# Patient Record
Sex: Female | Born: 1975 | Hispanic: No | Smoking: Current some day smoker
Health system: Southern US, Community
[De-identification: ages and names within clinical notes are randomized; demographics above are authoritative.]

## PROBLEM LIST (undated history)

## (undated) DIAGNOSIS — F419 Anxiety disorder, unspecified: Secondary | ICD-10-CM

## (undated) DIAGNOSIS — F32A Depression, unspecified: Secondary | ICD-10-CM

## (undated) DIAGNOSIS — G4700 Insomnia, unspecified: Secondary | ICD-10-CM

## (undated) DIAGNOSIS — E785 Hyperlipidemia, unspecified: Secondary | ICD-10-CM

## (undated) DIAGNOSIS — F329 Major depressive disorder, single episode, unspecified: Secondary | ICD-10-CM

## (undated) DIAGNOSIS — F3181 Bipolar II disorder: Secondary | ICD-10-CM

## (undated) HISTORY — DX: Bipolar II disorder: F31.81

## (undated) HISTORY — DX: Hyperlipidemia, unspecified: E78.5

## (undated) HISTORY — DX: Major depressive disorder, single episode, unspecified: F32.9

## (undated) HISTORY — PX: TONSILLECTOMY: SUR1361

## (undated) HISTORY — DX: Insomnia, unspecified: G47.00

## (undated) HISTORY — DX: Depression, unspecified: F32.A

## (undated) HISTORY — DX: Anxiety disorder, unspecified: F41.9

---

## 2009-04-28 ENCOUNTER — Ambulatory Visit: Payer: Self-pay | Admitting: Gynecology

## 2009-06-16 ENCOUNTER — Ambulatory Visit: Payer: Self-pay | Admitting: Obstetrics and Gynecology

## 2010-06-25 LAB — CONVERTED CEMR LAB: Pap Smear: NORMAL

## 2010-06-26 ENCOUNTER — Ambulatory Visit: Payer: Self-pay | Admitting: Gynecology

## 2010-06-26 ENCOUNTER — Other Ambulatory Visit: Admission: RE | Admit: 2010-06-26 | Discharge: 2010-06-26 | Payer: Self-pay | Admitting: Gynecology

## 2010-07-07 ENCOUNTER — Encounter: Payer: Self-pay | Admitting: Internal Medicine

## 2010-07-07 ENCOUNTER — Ambulatory Visit: Payer: Self-pay | Admitting: Gynecology

## 2010-07-14 ENCOUNTER — Ambulatory Visit: Payer: Self-pay | Admitting: Internal Medicine

## 2010-07-14 DIAGNOSIS — E785 Hyperlipidemia, unspecified: Secondary | ICD-10-CM

## 2010-07-14 DIAGNOSIS — F17200 Nicotine dependence, unspecified, uncomplicated: Secondary | ICD-10-CM

## 2010-07-15 ENCOUNTER — Encounter: Payer: Self-pay | Admitting: Internal Medicine

## 2010-09-02 ENCOUNTER — Ambulatory Visit
Admission: RE | Admit: 2010-09-02 | Discharge: 2010-09-02 | Payer: Self-pay | Source: Home / Self Care | Attending: Internal Medicine | Admitting: Internal Medicine

## 2010-09-02 DIAGNOSIS — G4700 Insomnia, unspecified: Secondary | ICD-10-CM | POA: Insufficient documentation

## 2010-09-02 DIAGNOSIS — F3181 Bipolar II disorder: Secondary | ICD-10-CM | POA: Insufficient documentation

## 2010-09-08 NOTE — Assessment & Plan Note (Signed)
Summary: NEW TO EST/ CHOLESTROL PROBLEM//PH   Vital Signs:  Patient profile:   35 year old female Height:      66.5 inches Weight:      175.13 pounds BMI:     27.94 Pulse rate:   75 / minute Pulse rhythm:   regular BP sitting:   122 / 80  (left arm) Cuff size:   regular  Vitals Entered By: Army Fossa CMA (July 14, 2010 3:30 PM) CC: New to establish,  Comments Refered by gyn- cholesterol was high.  [facility]    History of Present Illness: New patient Julia Frank to see her gynecologist Dr. Lily Peer, Pap smear was normal, her FLP was checked twice, the last FLP is as follows:  Total cholesterol 237, triglycerides 304, LDL 149. Apparently her B12 was   low.  TSH reportedly normal  Preventive Screening-Counseling & Management  Alcohol-Tobacco     Alcohol type: weekends     Smoking Status: current     Packs/Day: 10 a day  Caffeine-Diet-Exercise     Does Patient Exercise: no      Drug Use:  no.    Current Medications (verified): 1)  Zyrtec Allergy 10 Mg Tabs (Cetirizine Hcl) .[name].. 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   Risk Factors: Exercise: no (07/14/2010)  Risk Factors: Smoking Status: current (07/14/2010) Packs/Day: 10 a day (07/14/2010)  Past Medical History: h/o elevated cholesterol     Past Surgical History: Tonsillectomy  Family History: Diabetes-- GF HTN--  ?M MI-- GF (old age) colon ca--no breast ca--no  Social History: divorced no children original from [place]  Current Smoker-- < 1ppd Alcohol use-yes Drug  use-no 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   Neck:  no thyromegaly Lungs:  normal respiratory effort, no intercostal retractions, no accessory muscle use, and normal breath sounds.   Heart:  normal rate, regular rhythm, no murmur, and no gallop.   Extremities:  no lower extremity edema   Impression & Recommendations:  Problem # 1:  HYPERLIPIDEMIA (ICD-272.4) mild hyperlipidemia based on her last FLP, her triglycerides were 304. Patient reports that even at a younger age when she was well within a healthy   BMI her cholesterol was slightly elevated. She  moved to the [place] in 2009, admits to some increasing weight since then. He had extensive conversation in reference to diet and exercise. plan:  refer her to a nutritionist [facility])  Orders: Nutrition Referral (Nutrition)  Problem # 2:  TOBACCO ABUSE (ICD-305.1)  we had extensive conversation about tobacco abuse Risks and complications discussed She tried Chantix before and could not tolerate it due to depression we discussed the use of a patch Information provided regards a quitting program @ the hospital  Orders: Tobacco use cessation intermediate 3-10 minutes (78469)  Problem # 3:  time spent more  than 20 minutes. CC Dr. Lily Peer  Complete Medication List: 1)  Zyrtec Allergy 10 Mg Tabs (Cetirizine hcl) .[name].. 1 by mouth daily 2)  Nasonex 50 Mcg/act Susp (Mometasone furoate) 3)  B12   Patient Instructions: 1)  came back fasting in 3 months for labs only 2)  FLP---dx hyperlipidemia 3)  Please schedule a follow-up appointment in 6 months .    Orders Added: 1)  New Patient Level II [99202] 2)  Tobacco use cessation intermediate 3-10 minutes [99406] 3)   Nutrition Referral [Nutrition]   Immunization History:  Tetanus/Td Immunization History:    Tetanus/Td:  historical (08/10/2007)   Immunization History:  Tetanus/Td Immunization History:    Tetanus/Td:  Historical (08/10/2007)   Risk Factors:  Tobacco use:  current    Cigarettes:  Yes -- 10 a day pack(s) per day Drug use:  no Alcohol use:  yes    Type:  weekends Exercise:  no  PAP Smear History:     Date of Last PAP Smear:  06/25/2010    Results:  normal-per pt      Preventive Care Screening  Pap Smear:    Date:  06/25/2010    Results:  normal-per pt   Last Tetanus Booster:    Date:  08/10/2007    Results:  Historical

## 2010-09-08 NOTE — Letter (Signed)
Summary: Generic Letter  [facility] [place]  [address] [place], [place] [address]   Phone: [phone]  Fax: [phone]      07/15/2010    REF: Jasmaine Carne [address] [place], [place]  [address]   Dr Reynaldo Minium  Dear [name]:  Thank you for sending Julia Frank to our practice, she has mild to moderate  hypertriglyceridemia; she is also a smoker.  We discussed diet, exercise and tobacco cessation.  Will recheck her in 3 months. I'm  enclosing my office note but if you have any questions please don't hesitate to call me.  Again, thank you very much for send her to our practice.  Sincerely,         Willow Ora MD  Appended Document: Generic Letter faxed to [email or web address].

## 2010-09-10 NOTE — Assessment & Plan Note (Signed)
Summary: discuss sleeping pill/kn   Vital Signs:  Patient profile:   35 year old female Weight:      173 pounds Pulse rate:   86 / minute Pulse rhythm:   regular BP sitting:   120 / 82  (left arm) Cuff size:   regular  Vitals Entered By: Army Fossa CMA (September 02, 2010 4:14 PM) CC: Pt here to discuss sleeping aid and something for depression Comments [facility]    History of Present Illness: long history of insomnia, she used to take clonazepam with good results. Lately she has been taking Unisom which caused her to be drowsy next day.  Also long history of anxiety and depression, in the past she tried Lexapro which helped   but was very expensive.  She also try sertraline before which  worked relatively well. her depression has been on and off for years, she self restart sertraline 50 mg  ~ 3  month ago after she split from her boy friend (they are together again)   review of systems Currently, her depression is relatively okay but  needs  improvement. She also has some anxiety No suicidal ideas  Current Medications (verified): 1)  Zyrtec Allergy 10 Mg Tabs (Cetirizine Hcl) .[name].. 1 By Mouth Daily 2)  Nasonex 50 Mcg/act Susp (Mometasone Furoate) 3)  B12  Allergies (verified): No Known Drug Allergies  Past History:  Past Medical History: h/o elevated cholesterol   insomnia Anxiety  Past Surgical History: Reviewed history from 07/14/2010 and no changes required. Tonsillectomy  Social History: divorced no children original from [place] , moved to [place]  ~ 2009 Current Smoker-- < 1ppd Alcohol use-yes Drug use-no Regular exercise- improved elementary school teacher   Physical Exam  General:  alert and well-developed.   Psych:  Oriented X3, normally interactive, good eye contact, and not depressed appearing.  slightly anxious   Impression & Recommendations:  Problem # 1:  ANXIETY (ICD-300.00) history of anxiety and depression Lexapro  helped very well but it was expensive Currently taking sertraline 50 mg daily and symptoms needs improvment Options include to go back to Lexapro, increase sertraline or switch to another agent. She elected to increase sertraline Will reassess in few  months strongly recommended to see a counselor  Her updated medication list for this problem includes:    Clonazepam 0.25 Mg Tbdp (Clonazepam) .[name]... 1 or 2 by mouth at bedtime as needed insomnia    Sertraline Hcl 50 Mg Tabs (Sertraline hcl) .[name]... 1.5 tabs daily  Problem # 2:  INSOMNIA-SLEEP DISORDER-UNSPEC (ICD-780.52) long history of insomnia, clonazepam worked well for her she is aware of risk of addiction which in her case seems low. Also knows not to take it if pregnant. Prescribed lorazepam to be taken as needed  Problem # 3:  face to face today >  15 minutes, more than 50% of the time counseling  Complete Medication List: 1)  Zyrtec Allergy 10 Mg Tabs (Cetirizine hcl) .[name].. 1 by mouth daily 2)  Nasonex 50 Mcg/act Susp (Mometasone furoate) 3)  B12  4)  Clonazepam 0.25 Mg Tbdp (Clonazepam) .[name].. 1 or 2 by mouth at bedtime as needed insomnia 5)  Sertraline Hcl 50 Mg Tabs (Sertraline hcl) .... 1.5 tabs daily  Patient Instructions: 1)  Please schedule a follow-up appointment in 8 to 10 weeks , fasting Prescriptions: SERTRALINE HCL 50 MG TABS (SERTRALINE HCL) 1.5 tabs daily  #60 x 1   Entered and Authorized by:   Wanda Plump  MD   Signed by:   Nolon Rod Reggie Welge MD on 09/02/2010   Method used:   Print then Give to Patient   RxID:   [id number] CLONAZEPAM 0.25 MG TBDP (CLONAZEPAM) 1 or 2 by mouth at bedtime as needed insomnia  #60 x 1   Entered and Authorized by:   Elita Quick E. Roger Kettles MD   Signed by:   Nolon Rod. Deirdra Heumann MD on 09/02/2010   Method used:   Print then Give to Patient   RxID:   [id number]    Orders Added: 1)  Est. Patient Level III [84696]

## 2010-09-14 ENCOUNTER — Telehealth: Payer: Self-pay | Admitting: Internal Medicine

## 2010-09-24 NOTE — Progress Notes (Signed)
Summary: Clonazepam clarification  Phone Note Refill Request Message from:  Pharmacy on September 14, 2010 11:19 AM  Refills Requested: Medication #1:  CLONAZEPAM 0.25 MG TBDP 1 or 2 by mouth at bedtime as needed insomnia Pharmacy called wondering if patient dosage was decreased on purpose. They state she was previously on 1mg . Please advise.  Initial call taken by: Lucious Groves CMA,  September 14, 2010 11:19 AM  Follow-up for Phone Call        she was taking a higher dose prescribed by somebody else. Please keep her on the  0.25 mg tablet Follow-up by: Nolon Rod. Tashya Alberty MD,  September 14, 2010 4:05 PM  Additional Follow-up for Phone Call Additional follow up Details #1::        Lm at home and work to call back to office. Lucious Groves CMA  September 14, 2010 4:12 PM   No return call, left message on machine at home and work to call back to office. Lucious Groves CMA  September 15, 2010 10:47 AM   Patient notified of the above and she states that she was previously using drops and that 0.25mg  does not exist. She states that she needs 0.5 mg tabs. She uses [facility]. Please advise. Lucious Groves CMA  September 15, 2010 11:03 AM     Additional Follow-up for Phone Call Additional follow up Details #2::    ok to change, same sig. Laurell Coalson E. Lakaya Tolen MD  September 15, 2010 1:08 PM   Additional Follow-up for Phone Call Additional follow up Details #3:: Details for Additional Follow-up Action Taken: Phoned to pharmacy. Left message on voicemail notifying the patient. Additional Follow-up by: Lucious Groves CMA,  September 16, 2010 9:09 AM  New/Updated Medications: CLONAZEPAM 0.5 MG TABS (CLONAZEPAM) 1 or 2 by mouth at bedtime as needed insomnia Prescriptions: CLONAZEPAM 0.5 MG TABS (CLONAZEPAM) 1 or 2 by mouth at bedtime as needed insomnia  #60 x 1   Entered by:   Lucious Groves CMA   Authorized by:   Nolon Rod. Prairie Stenberg MD   Signed by:   Lucious Groves CMA on 09/16/2010   Method used:   Telephoned to ...  [facility]  [address]  [phone]* (retail)       [address]       [place], [place]  [address]       Ph: [phone] or [phone]       Fax: [phone]   RxID:   [id number]

## 2010-10-28 ENCOUNTER — Other Ambulatory Visit: Payer: Self-pay | Admitting: Internal Medicine

## 2010-10-29 MED ORDER — CLONAZEPAM 0.5 MG PO TABS: 0.5000 mg | ORAL_TABLET | Freq: Two times a day (BID) | ORAL | Status: DC

## 2010-10-29 MED ORDER — SERTRALINE HCL 50 MG PO TABS: 50.0000 mg | ORAL_TABLET | Freq: Every day | ORAL | Status: DC

## 2010-10-29 NOTE — Telephone Encounter (Signed)
Please call clonazepam 0.5 mg one or 2 at bedtime as needed for insomnia. #60, one refill.  okay sertraline, #60, one refill Needs a  office visit within 2 months

## 2010-11-07 ENCOUNTER — Encounter: Payer: Self-pay | Admitting: Internal Medicine

## 2010-11-10 ENCOUNTER — Ambulatory Visit (INDEPENDENT_AMBULATORY_CARE_PROVIDER_SITE_OTHER): Payer: BC Managed Care – PPO | Admitting: Internal Medicine

## 2010-11-10 ENCOUNTER — Encounter: Payer: Self-pay | Admitting: Internal Medicine

## 2010-11-10 DIAGNOSIS — R7989 Other specified abnormal findings of blood chemistry: Secondary | ICD-10-CM

## 2010-11-10 DIAGNOSIS — F411 Generalized anxiety disorder: Secondary | ICD-10-CM

## 2010-11-10 DIAGNOSIS — E785 Hyperlipidemia, unspecified: Secondary | ICD-10-CM

## 2010-11-10 DIAGNOSIS — G4700 Insomnia, unspecified: Secondary | ICD-10-CM

## 2010-11-10 NOTE — Assessment & Plan Note (Signed)
Chronic poor control Pt suggested psych referal: will arrange Cont same meds for now

## 2010-11-10 NOTE — Assessment & Plan Note (Signed)
No change 

## 2010-11-10 NOTE — Assessment & Plan Note (Signed)
Chol levated @ gyn 06-2010 recheck

## 2010-11-10 NOTE — Assessment & Plan Note (Signed)
CBC @ gyn 06-2010 showed a elevated MCV Recheck

## 2010-11-10 NOTE — Progress Notes (Signed)
  Subjective:    Patient ID: Julia Frank, female    DOB: 06-17-1976, 35 y.o.   MRN: [id number]  HPI Last office visit was 08/2010, we increase sertraline 50mg  to 1.5 tab a day, good compliance; her sx however have not improved ; gets really anxious on Sundays specifically  Needs cholesterol checked Wonders why she was recommended folic acid in the past, chart reviewed, MCV was slightly elevated at gynecology. That is probably the reason. See assessment and plan   Past Medical History  Diagnosis Date  . Elevated cholesterol   . Insomnia   . Anxiety    Past Surgical History  Procedure Date  . Tonsillectomy    History   Social History  . Marital Status: Divorced    Spouse Name: N/A    Number of Children: 0  . Years of Education: N/A   Occupational History  . Elementary school teacher    Social History Main Topics  . Smoking status: Current Everyday Smoker -- 1.0 packs/day  . Smokeless tobacco: Not on file  . Alcohol Use: Yes     socially   . Drug Use: No  . Sexually Active: Not on file   Other Topics Concern  . Not on file   Social History Narrative   Original from [place], moved to [place].       Review of Systems Has not exercising much lately Diet still ok Sleeps mostly "OK", clonazepam not helping as much as before  No suicidal ideas     Objective:   Physical Exam  Constitutional: She appears well-developed and well-nourished.  Cardiovascular: Normal rate, regular rhythm and normal heart sounds.   Pulmonary/Chest: Effort normal and breath sounds normal. No respiratory distress. She has no wheezes.  Musculoskeletal: She exhibits no edema.  Psychiatric: Her behavior is normal. Judgment and thought content normal.       slt anxious , no depressed          Assessment & Plan:

## 2010-11-11 LAB — LIPID PANEL
Cholesterol: 234 mg/dL — ABNORMAL HIGH (ref 0–200)
VLDL: 69.6 mg/dL — ABNORMAL HIGH (ref 0.0–40.0)

## 2010-11-11 LAB — FOLATE: Folate: 14.2 ng/mL (ref >5.9)

## 2010-11-11 LAB — VITAMIN B12: Vitamin B-12: 281 pg/mL (ref 211–911)

## 2010-12-10 ENCOUNTER — Telehealth: Payer: Self-pay | Admitting: Internal Medicine

## 2010-12-10 NOTE — Telephone Encounter (Signed)
Both were last refilled on 10/28/10-- okay to refill?

## 2010-12-10 NOTE — Telephone Encounter (Signed)
Patient called [place] because she is confused about a message that [facility] [address] gave to her----patient needs refills on her Sertraline and her Clonazepam---if one of those meds  [facility], she needs the other---if neither one has been called in, then she needs both called in

## 2010-12-11 MED ORDER — SERTRALINE HCL 50 MG PO TABS: ORAL_TABLET | ORAL | Status: DC

## 2010-12-11 MED ORDER — CLONAZEPAM 0.5 MG PO TABS: 0.5000 mg | ORAL_TABLET | Freq: Two times a day (BID) | ORAL | Status: DC

## 2010-12-11 NOTE — Telephone Encounter (Signed)
Okay to call clonazepam, 60 tablets no refill. Ok to call sertraline 1 month  and 2 refills

## 2011-01-07 ENCOUNTER — Other Ambulatory Visit: Payer: Self-pay | Admitting: Internal Medicine

## 2011-01-07 NOTE — Telephone Encounter (Signed)
Last refilled 12/11/10 #60, no rfs.

## 2011-01-08 MED ORDER — CLONAZEPAM 0.5 MG PO TABS: 0.5000 mg | ORAL_TABLET | Freq: Two times a day (BID) | ORAL | Status: DC

## 2011-01-08 NOTE — Telephone Encounter (Signed)
60, 1 RF 

## 2011-02-02 ENCOUNTER — Other Ambulatory Visit: Payer: Self-pay | Admitting: Internal Medicine

## 2011-02-02 MED ORDER — SERTRALINE HCL 50 MG PO TABS: ORAL_TABLET | ORAL | Status: DC

## 2011-02-02 NOTE — Telephone Encounter (Signed)
Sent in new script  

## 2011-02-23 ENCOUNTER — Encounter: Payer: Self-pay | Admitting: Internal Medicine

## 2011-02-23 ENCOUNTER — Ambulatory Visit: Payer: BC Managed Care – PPO | Admitting: Family Medicine

## 2011-02-23 ENCOUNTER — Ambulatory Visit (INDEPENDENT_AMBULATORY_CARE_PROVIDER_SITE_OTHER): Payer: BC Managed Care – PPO | Admitting: Internal Medicine

## 2011-02-23 DIAGNOSIS — E785 Hyperlipidemia, unspecified: Secondary | ICD-10-CM

## 2011-02-23 DIAGNOSIS — F411 Generalized anxiety disorder: Secondary | ICD-10-CM

## 2011-02-23 MED ORDER — ATORVASTATIN CALCIUM 10 MG PO TABS: 10.0000 mg | ORAL_TABLET | Freq: Every day | ORAL | Status: DC

## 2011-02-23 NOTE — Progress Notes (Signed)
  Subjective:    Patient ID: Julia Frank, female    DOB: [birth year], 35 y.o.   MRN: [id number]  HPI Developed itching proximal to all her nails 3 days ago after she got acrylic nails put on. Symptoms resolves. This is the second time this happened. Also would like to start cholesterol  medication, she is dieting better and exercising more but she has been unable to lose weight, in fact has gained 5 pounds. Occasional paresthesias in both hands, mostly at night, but sometimes depending on how she holds the telephone.  Past Medical History  Diagnosis Date  . Elevated cholesterol   . Insomnia   . Anxiety    Past Surgical History  Procedure Date  . Tonsillectomy      Review of Systems Denies neck pain, rash in the neck or arms. No actual swelling or redness in the fingers. As far as anxiety, she is doing better, good compliance with medicines, instead of a psychiatrist , she went to see a psychologist and that is helping. Her counselor suggested maybe switch to Cymbalta. She is not convinced.    Objective:   Physical Exam  Constitutional: She is oriented to person, place, and time. She appears well-developed and well-nourished.  Neck: Normal range of motion. Neck supple.       Nontender to palpation at the cervical spine  Neurological: She is alert and oriented to person, place, and time.       Motor exam DTRs symmetric and normal. Pinprick examination of the upper extremities normal.   Skin:       Skin and the hands and fingers normal  Psychiatric: She has a normal mood and affect. Her behavior is normal. Judgment and thought content normal.           Assessment & Plan:  Allergic reaction to the abdomen the nails? See history of present illness. Recommend avoidance

## 2011-02-23 NOTE — Patient Instructions (Signed)
"  carpal tunnel syndrome" splinters Start lipitor, can not take lipitor if pregnant!

## 2011-02-23 NOTE — Assessment & Plan Note (Signed)
See review of systems, overall doing better with current medications and seeing a counselor. We discussed possibly switch to Cymbalta (the patient has a slightly decreased libido, counselor suggested that medicine) but she elected to stay on current meds

## 2011-02-23 NOTE — Assessment & Plan Note (Addendum)
Patient is concerned about her cholesterol, healthier lifestyle has not decreased her weight although she recognizes that her cloths fit  "better" She has a strong family history of high cholesterol, no early CAD. We discussed options:  Recheck cholesterol and start medication based on the new FLP or start cholesterol today.  She elected to start medication today. We will start Lipitor. Aware of side effects, knows not to take it if she is pregnant. She's not on BCPs but her boyfriend had a vasectomy.

## 2011-03-19 ENCOUNTER — Encounter: Payer: Self-pay | Admitting: Internal Medicine

## 2011-03-19 ENCOUNTER — Ambulatory Visit (INDEPENDENT_AMBULATORY_CARE_PROVIDER_SITE_OTHER)
Admission: RE | Admit: 2011-03-19 | Discharge: 2011-03-19 | Disposition: A | Discharge status: Home / Self Care | Payer: BC Managed Care – PPO | Source: Ambulatory Visit | Attending: Internal Medicine | Admitting: Internal Medicine

## 2011-03-19 ENCOUNTER — Ambulatory Visit (INDEPENDENT_AMBULATORY_CARE_PROVIDER_SITE_OTHER): Payer: BC Managed Care – PPO | Admitting: Internal Medicine

## 2011-03-19 VITALS — BP 122/84 | HR 66 | Temp 98.0°F | Resp 14 | Wt 173.0 lb

## 2011-03-19 DIAGNOSIS — S99929A Unspecified injury of unspecified foot, initial encounter: Secondary | ICD-10-CM | POA: Insufficient documentation

## 2011-03-19 DIAGNOSIS — S8990XA Unspecified injury of unspecified lower leg, initial encounter: Secondary | ICD-10-CM

## 2011-03-19 DIAGNOSIS — F17200 Nicotine dependence, unspecified, uncomplicated: Secondary | ICD-10-CM

## 2011-03-19 DIAGNOSIS — F411 Generalized anxiety disorder: Secondary | ICD-10-CM

## 2011-03-19 MED ORDER — BUPROPION HCL ER (XL) 150 MG PO TB24: 150.0000 mg | ORAL_TABLET | ORAL | Status: DC

## 2011-03-19 NOTE — Progress Notes (Signed)
  Subjective:    Patient ID: Julia Frank, female    DOB: [birth year], 35 y.o.   MRN: [id number]  HPI Larey Seat a week ago, injured her left foot, ecchymoses and swelling are better, still tender , moderately so. She quit tobacco, using "step 3" nicotine supplementation, occasionally feels nauseous. Wonder if she needs less nicotine. Since she quit tobacco, she is more depressed and wonders if Wellbutrin would be a good idea for her.  Past Medical History  Diagnosis Date  . Elevated cholesterol   . Insomnia   . Anxiety    Past Surgical History  Procedure Date  . Tonsillectomy      Review of Systems Denies any head injury, neck injury. No loss of consciousness. No suicide ideas    Objective:   Physical Exam Alert, oriented x3, no apparent distress. Lower extremities without edema, good pedal pulses bilaterally. Left foot: No deformities, swelling, ecchymoses. Slightly tender proximaly from the fifth toe       Assessment & Plan:

## 2011-03-19 NOTE — Assessment & Plan Note (Signed)
Resolving foot injury, she's afraid to back to exercise without knowing that she is okay. We'll do a x-ray. See instructions.

## 2011-03-19 NOTE — Assessment & Plan Note (Signed)
Since her last office visit she quit tobacco and she is feeling depressed, no suicidal. Wonders if Wellbutrin is a good option. I think it is, will help with her mood and hopefully help her stay away from tobacco. Start Wellbutrin SR 150. Reassess in one month. She also wonders if she could stop Zoloft, recommend not to do so for now

## 2011-03-19 NOTE — Patient Instructions (Addendum)
Motrin and ice if pain Go back gradually to exercise Call if not completely well in 10 days  Start wellbutrin, do not stop other meds

## 2011-03-19 NOTE — Assessment & Plan Note (Addendum)
Praised for quitting!!! Agree that step 3 is too much, she reports did not smoke 1PPD every day rec to go to step 2. Wellbutrin? I think  is actually a very good idea because may help her to quit tobacco and also because she is feeling depressed

## 2011-03-25 ENCOUNTER — Telehealth: Payer: Self-pay | Admitting: Internal Medicine

## 2011-03-25 NOTE — Telephone Encounter (Signed)
Spoke w/ pt informed of results but says that foot is still very sore. Hasn't had any swelling or redness and would like to know what next step is.

## 2011-03-25 NOTE — Telephone Encounter (Signed)
rec to wait 1 more week, if no better will refer to ortho.

## 2011-04-01 NOTE — Telephone Encounter (Signed)
Spoke w/ pt says that foot is feeling much better informed pt if symptoms return will call if symptoms return

## 2011-04-04 ENCOUNTER — Other Ambulatory Visit: Payer: Self-pay | Admitting: Internal Medicine

## 2011-04-05 MED ORDER — CLONAZEPAM 0.5 MG PO TABS: 1.0000 mg | ORAL_TABLET | Freq: Every day | ORAL | Status: DC

## 2011-04-16 ENCOUNTER — Other Ambulatory Visit: Payer: Self-pay | Admitting: Internal Medicine

## 2011-04-16 NOTE — Telephone Encounter (Signed)
Wellbutrin request [last refill 03/19/11 #30x2]

## 2011-04-20 ENCOUNTER — Ambulatory Visit: Payer: BC Managed Care – PPO | Admitting: Internal Medicine

## 2011-04-22 NOTE — Telephone Encounter (Signed)
Ok,  call Wellbutrin 1 month, 3RF Also advise pt not to stop zoloft OV here in 3 months

## 2011-04-23 NOTE — Telephone Encounter (Signed)
This medication was refilled on 03/19/2011 #30 with [2] refills. It is too early for new refill on this order. Pharmacy verified that this Rx has refills on hold.

## 2011-04-29 ENCOUNTER — Ambulatory Visit (INDEPENDENT_AMBULATORY_CARE_PROVIDER_SITE_OTHER): Payer: BC Managed Care – PPO | Admitting: Internal Medicine

## 2011-04-29 ENCOUNTER — Encounter: Payer: Self-pay | Admitting: Women's Health

## 2011-04-29 ENCOUNTER — Encounter: Payer: Self-pay | Admitting: Internal Medicine

## 2011-04-29 ENCOUNTER — Ambulatory Visit (INDEPENDENT_AMBULATORY_CARE_PROVIDER_SITE_OTHER): Payer: BC Managed Care – PPO | Admitting: Women's Health

## 2011-04-29 DIAGNOSIS — L293 Anogenital pruritus, unspecified: Secondary | ICD-10-CM

## 2011-04-29 DIAGNOSIS — G4700 Insomnia, unspecified: Secondary | ICD-10-CM

## 2011-04-29 DIAGNOSIS — B373 Candidiasis of vulva and vagina: Secondary | ICD-10-CM

## 2011-04-29 DIAGNOSIS — F411 Generalized anxiety disorder: Secondary | ICD-10-CM

## 2011-04-29 DIAGNOSIS — N898 Other specified noninflammatory disorders of vagina: Secondary | ICD-10-CM

## 2011-04-29 DIAGNOSIS — R7989 Other specified abnormal findings of blood chemistry: Secondary | ICD-10-CM

## 2011-04-29 DIAGNOSIS — E785 Hyperlipidemia, unspecified: Secondary | ICD-10-CM

## 2011-04-29 LAB — LIPID PANEL
Cholesterol: 105 mg/dL (ref 0–200)
Triglycerides: 69 mg/dL (ref 0.0–149.0)
VLDL: 13.8 mg/dL (ref 0.0–40.0)

## 2011-04-29 LAB — AST: AST: 25 U/L (ref 0–37)

## 2011-04-29 MED ORDER — FLUCONAZOLE 150 MG PO TABS: 150.0000 mg | ORAL_TABLET | Freq: Once | ORAL | Status: AC

## 2011-04-29 NOTE — Progress Notes (Signed)
  Presents with a complaint of vaginal itching. Same partner monthly cycle/vasectomy. Denies any urinary symptoms, denies odor with discharge. Exam: abdomen is soft nontender, external genitalia is erythematous at introitus, speculum exam moderate amount of curdy discharge was noted. Wet prep positive for yeast bimanual no CMT or adnexal fullness or tenderness.  Yeast vaginitis  Plan: Diflucan 150 by mouth x1 dose with refill, yeast prevention discussed call if no relief.

## 2011-04-29 NOTE — Assessment & Plan Note (Signed)
Discontinue B12 and folic acid a month ago, likes a recheck

## 2011-04-29 NOTE — Assessment & Plan Note (Signed)
Doing great on Lipitor, no side effects, lab Has changed her lifestyle, praised!

## 2011-04-29 NOTE — Assessment & Plan Note (Addendum)
Doing extremely well since Wellbutrin was added to zoloft. No change

## 2011-04-29 NOTE — Progress Notes (Signed)
  Subjective:    Patient ID: Julia Frank, female    DOB: 04-Dec-1975, 35 y.o.   MRN: [id number]  HPI Routine visit High cholesterol, started Lipitor, and good compliance, no side effects. Anxiety, we added Wellbutrin, it has made a huge difference, feeling great. Tobacco abuse, has discontinued tobacco! Hardly ever drinking alcohol. discontinued B12 and folate acid, would like to be rechecked. Still needs medication to help her sleep. Past Medical History  Diagnosis Date  . Elevated cholesterol   . Insomnia   . Anxiety   . Depression     Past Surgical History  Procedure Date  . Tonsillectomy      Review of Systems Denies chest pain or shortness of breath No nausea, vomiting, diarrhea. No myalgias or arthralgias    Objective:   Physical Exam  Constitutional: She is oriented to person, place, and time. She appears well-developed and well-nourished. No distress.  Cardiovascular: Normal rate, regular rhythm and normal heart sounds.   Pulmonary/Chest: Effort normal and breath sounds normal. No respiratory distress. She has no wheezes. She has no rales.  Musculoskeletal: She exhibits no edema.  Neurological: She is alert and oriented to person, place, and time.  Skin: She is not diaphoretic.  Psychiatric: She has a normal mood and affect. Her behavior is normal. Judgment and thought content normal.          Assessment & Plan:

## 2011-04-29 NOTE — Assessment & Plan Note (Signed)
Doing great, still uses medication to help her sleep as needed

## 2011-05-03 ENCOUNTER — Telehealth: Payer: Self-pay

## 2011-05-03 MED ORDER — TERCONAZOLE 0.4 % VA CREA: 1.0000 | TOPICAL_CREAM | Freq: Every day | VAGINAL | Status: AC

## 2011-05-03 NOTE — Telephone Encounter (Signed)
Pt. Notified of Nancy's note below and sent rx to her pharmacy.

## 2011-05-03 NOTE — Telephone Encounter (Signed)
ON 04-29-11 OV, YOU GAVE HER DIFLUCAN 150 #1 PILL WITH 1 REFILL. SHE HAS USED BOTH & IS STILL HAVING PERSISTENT ITCHING. REQUESTING A STRONGER RX.

## 2011-05-03 NOTE — Telephone Encounter (Signed)
Julia Frank please call patient and call in Terazole 7 one applicator at bedtime x7, office visit if no relief.

## 2011-05-11 ENCOUNTER — Ambulatory Visit: Payer: BC Managed Care – PPO | Admitting: Internal Medicine

## 2011-05-12 ENCOUNTER — Ambulatory Visit: Payer: BC Managed Care – PPO | Admitting: Internal Medicine

## 2011-06-08 ENCOUNTER — Telehealth: Payer: Self-pay | Admitting: Internal Medicine

## 2011-06-08 ENCOUNTER — Other Ambulatory Visit: Payer: Self-pay | Admitting: Internal Medicine

## 2011-06-08 MED ORDER — CLONAZEPAM 0.5 MG PO TABS: 1.0000 mg | ORAL_TABLET | Freq: Every evening | ORAL | Status: DC | PRN

## 2011-06-08 NOTE — Telephone Encounter (Signed)
klonipin .5 mg [facility] [address]

## 2011-06-08 NOTE — Telephone Encounter (Signed)
Refill done.  

## 2011-06-11 ENCOUNTER — Other Ambulatory Visit: Payer: Self-pay | Admitting: Internal Medicine

## 2011-06-11 NOTE — Telephone Encounter (Signed)
Please advise for follow up?  

## 2011-06-11 NOTE — Telephone Encounter (Signed)
Advise patient to call her gynecologist, I have not prescribed any birth control for her in the past

## 2011-06-14 NOTE — Telephone Encounter (Signed)
Patient advised to contact her gynecologist for Rx of birth control

## 2011-06-17 ENCOUNTER — Other Ambulatory Visit: Payer: Self-pay | Admitting: Internal Medicine

## 2011-06-17 NOTE — Telephone Encounter (Signed)
Advised patient---> potassium normal.  Medication sent into pharmacy patient

## 2011-07-15 ENCOUNTER — Encounter: Payer: BC Managed Care – PPO | Admitting: Women's Health

## 2011-07-23 ENCOUNTER — Other Ambulatory Visit: Payer: Self-pay | Admitting: Internal Medicine

## 2011-07-23 MED ORDER — BUPROPION HCL ER (XL) 150 MG PO TB24: ORAL_TABLET | ORAL | Status: DC

## 2011-07-23 NOTE — Telephone Encounter (Signed)
Rx sent 

## 2011-08-17 ENCOUNTER — Telehealth: Payer: Self-pay | Admitting: *Deleted

## 2011-08-17 ENCOUNTER — Other Ambulatory Visit: Payer: Self-pay | Admitting: Internal Medicine

## 2011-08-17 NOTE — Telephone Encounter (Signed)
Pt left VM that she was told by Pysch to increase Zoloft 50 mg to 1 1/2 tab=75 mg. Pt notes that even with increase in med she is still having some depression/very sad. Pt indicated that she did just break up with her boy friend..Please advise

## 2011-08-18 MED ORDER — SERTRALINE HCL 50 MG PO TABS: 100.0000 mg | ORAL_TABLET | Freq: Every day | ORAL | Status: DC

## 2011-08-18 NOTE — Telephone Encounter (Signed)
Spoke with Julia Frank who states that she is seeing the Psychologist and they cannot Rx med so that why she contact Dr Drue Novel to let him know about med. Julia Frank also notes that she spoke with Dr Drue Novel about this issue at her last OV. Marland KitchenPlease advise

## 2011-08-18 NOTE — Telephone Encounter (Signed)
Increase Zoloft to 100 mg daily, office visit within 2 weeks. Sooner if worse or she has suicidal ideas

## 2011-08-18 NOTE — Telephone Encounter (Signed)
Discuss with patient  

## 2011-08-18 NOTE — Telephone Encounter (Signed)
Needs to discuss with her psychiatrist who is managing her depression

## 2011-08-26 ENCOUNTER — Telehealth: Payer: Self-pay | Admitting: Internal Medicine

## 2011-08-26 MED ORDER — BUPROPION HCL ER (XL) 150 MG PO TB24: ORAL_TABLET | ORAL | Status: DC

## 2011-08-26 NOTE — Telephone Encounter (Signed)
Refill- bupropion hcl xl 150mg  tablet. Take one tablet by mouth every morning. Qty 30 last fill 12.14.12

## 2011-08-26 NOTE — Telephone Encounter (Signed)
Pls advise.  

## 2011-08-26 NOTE — Telephone Encounter (Signed)
Ok 1 month, 1 RF

## 2011-08-26 NOTE — Telephone Encounter (Signed)
Rx sent to pharmacy   

## 2011-08-31 ENCOUNTER — Telehealth: Payer: Self-pay | Admitting: Internal Medicine

## 2011-08-31 NOTE — Telephone Encounter (Signed)
Rx escripted on 08/26/11. Pharmacist states patient has [2] profiles and it was received on incorrect file/filling now. LMOM to inform patient.

## 2011-08-31 NOTE — Telephone Encounter (Signed)
Patient states that she went to [facility] to pick up refill on wellbutrin and pharmacy told her that they never received refill.

## 2011-09-06 ENCOUNTER — Ambulatory Visit: Payer: BC Managed Care – PPO | Admitting: Internal Medicine

## 2011-09-06 ENCOUNTER — Ambulatory Visit (INDEPENDENT_AMBULATORY_CARE_PROVIDER_SITE_OTHER): Payer: BC Managed Care – PPO | Admitting: Internal Medicine

## 2011-09-06 ENCOUNTER — Encounter: Payer: Self-pay | Admitting: Internal Medicine

## 2011-09-06 VITALS — BP 108/74 | HR 82 | Temp 98.4°F | Wt 167.0 lb

## 2011-09-06 DIAGNOSIS — F419 Anxiety disorder, unspecified: Secondary | ICD-10-CM

## 2011-09-06 DIAGNOSIS — E785 Hyperlipidemia, unspecified: Secondary | ICD-10-CM

## 2011-09-06 DIAGNOSIS — F341 Dysthymic disorder: Secondary | ICD-10-CM

## 2011-09-06 MED ORDER — BUPROPION HCL ER (XL) 150 MG PO TB24: 300.0000 mg | ORAL_TABLET | Freq: Every day | ORAL | Status: DC

## 2011-09-06 NOTE — Assessment & Plan Note (Signed)
Symptoms are better but the patient recognizes there si " room for improvement" We talked about different options and we agreed to increase Wellbutrin to 300 mg. Prescriptions sent. Continue with sertraline 100 mg daily. Continue with counseling. Return in 2 months

## 2011-09-06 NOTE — Progress Notes (Signed)
  Subjective:    Patient ID: Julia Frank, female    DOB: 12-31-1975, 36 y.o.   MRN: [id number]  HPI Routine office visit She is for followup on anxiety and depression.  Past medical history Hyperlipidemia Anxiety, depression Insomnia  Past surgical history Tonsillectomy   Review of Systems Good compliance with medicines, she sees her psychologist every 2 weeks and gets counseling. Since the last time we communicate, she is better, thinks that Wellbutrin is helping a great deal however she recognizes that there is room to improvement on  her symptoms. Wonders if increased Wellbutrin would be a good idea. Denies any suicidal thoughts, insomnia well controlled She is rarely smoking, remains active, exercises frequently     Objective:   Physical Exam Alert oriented, in no apparent distress. Seems to be doing well.       Assessment & Plan:  Today , I spent more than 15  min with the patient, >50% of the time counseling

## 2011-09-06 NOTE — Assessment & Plan Note (Addendum)
Patient concerned about the amount of meds she takes, cholesterol is under excellent control. We agreed to decrease Lipitor to 5 mg daily.

## 2011-10-27 ENCOUNTER — Ambulatory Visit: Payer: BC Managed Care – PPO | Admitting: Internal Medicine

## 2011-11-01 ENCOUNTER — Ambulatory Visit: Payer: BC Managed Care – PPO | Admitting: Internal Medicine

## 2011-11-08 ENCOUNTER — Ambulatory Visit (INDEPENDENT_AMBULATORY_CARE_PROVIDER_SITE_OTHER): Payer: BC Managed Care – PPO | Admitting: Internal Medicine

## 2011-11-08 VITALS — BP 112/72 | HR 71 | Temp 98.6°F | Wt 163.0 lb

## 2011-11-08 DIAGNOSIS — F329 Major depressive disorder, single episode, unspecified: Secondary | ICD-10-CM

## 2011-11-08 DIAGNOSIS — E785 Hyperlipidemia, unspecified: Secondary | ICD-10-CM

## 2011-11-08 DIAGNOSIS — S7000XA Contusion of unspecified hip, initial encounter: Secondary | ICD-10-CM

## 2011-11-08 DIAGNOSIS — F341 Dysthymic disorder: Secondary | ICD-10-CM

## 2011-11-08 MED ORDER — DICLOFENAC POTASSIUM 50 MG PO TABS: 50.0000 mg | ORAL_TABLET | Freq: Three times a day (TID) | ORAL | Status: AC | PRN

## 2011-11-08 NOTE — Progress Notes (Signed)
  Subjective:    Patient ID: Julia Frank, female    DOB: [birth year], 36 y.o.   MRN: [id number]  HPI Acute visit Last weekend, she fell from a chair, landed on her right hip, complaining of "tailbone pain". No radiation. Has taken ibuprofen but usually diclofenac helps better. She is going to [place] pretty soon and would like a prescription for diclofenac.  Past medical history  Hyperlipidemia  Anxiety, depression  Insomnia  BC: depo shots per pt   Past surgical history  Tonsillectomy  Review of Systems She increased Lipitor from half to 1 tablet daily because it was very difficult to cut the pill. As far as anxiety and depression, she is doing much better, she has a new boyfriend, she self decreased Zoloft to 1 tablet daily only and she feels well. Denies any paresthesias in the lower extremities, no neck pain or other pains associated with the recent fall.    Objective:   Physical Exam  Alert oriented x3, no apparent distress. Back: Nontender to palpation on the lumbosacral spine. No deformities.  muscle skeletal, slightly tender at the site of the right hip. Range of motion of the hips is normal. No LE edema  Neurological exam DTR, motor strength are symmetric. Gait normal.      Assessment & Plan:  Hip contusion, recommend diclofenac, don't see a need for x-rays. Will call if not better.

## 2011-11-08 NOTE — Patient Instructions (Signed)
Diclofenac 3 times a day as needed, always take it with food, watch for stomach irritation ( gastritis) Please schedule a routine visit, fasting,  at your earliest convenience.

## 2011-11-09 ENCOUNTER — Encounter: Payer: Self-pay | Admitting: Internal Medicine

## 2011-11-09 NOTE — Assessment & Plan Note (Signed)
self increased Lipitor from 5-10 mg because it  was difficult to cut the tablet.

## 2011-11-09 NOTE — Assessment & Plan Note (Signed)
depression anxiety, doing better. Decreased  Zoloft to  50 mg daily. She sees a psychologist from time to time which is helping a lot.

## 2011-11-15 ENCOUNTER — Other Ambulatory Visit: Payer: Self-pay | Admitting: Internal Medicine

## 2011-11-15 NOTE — Telephone Encounter (Signed)
Refill done.  

## 2011-11-18 ENCOUNTER — Ambulatory Visit: Payer: Self-pay | Admitting: Obstetrics and Gynecology

## 2011-12-14 ENCOUNTER — Other Ambulatory Visit: Payer: Self-pay | Admitting: Internal Medicine

## 2011-12-14 NOTE — Telephone Encounter (Signed)
refill for clonazepam 0.5mg  tablet  Take 2-tab by mouth as needed at bedtime for anxiety  Last written 10.30.12  Qty 60 Last OV 4.2.13

## 2011-12-14 NOTE — Telephone Encounter (Signed)
Ok to refill 

## 2011-12-14 NOTE — Telephone Encounter (Signed)
60, no RF 

## 2011-12-15 MED ORDER — CLONAZEPAM 0.5 MG PO TABS: 1.0000 mg | ORAL_TABLET | Freq: Every evening | ORAL | Status: DC | PRN

## 2011-12-15 NOTE — Telephone Encounter (Signed)
Refill done.  

## 2012-01-25 ENCOUNTER — Other Ambulatory Visit: Payer: Self-pay | Admitting: Internal Medicine

## 2012-01-25 MED ORDER — CLONAZEPAM 0.5 MG PO TABS: 1.0000 mg | ORAL_TABLET | Freq: Every evening | ORAL | Status: DC | PRN

## 2012-01-25 NOTE — Telephone Encounter (Signed)
60, 1 RF 

## 2012-01-25 NOTE — Telephone Encounter (Signed)
Ok to refill 

## 2012-01-25 NOTE — Telephone Encounter (Signed)
refill clonazepam 0.5mg  tablet , take 2-tablets by mouth as needed at bedtime for anxiety, no qty listed  Last wrt. 5.8.13, qty 60, lat ov 4.2.13

## 2012-01-25 NOTE — Telephone Encounter (Signed)
Refill done.  

## 2012-01-30 ENCOUNTER — Other Ambulatory Visit: Payer: Self-pay | Admitting: Internal Medicine

## 2012-02-18 ENCOUNTER — Other Ambulatory Visit: Payer: Self-pay | Admitting: Internal Medicine

## 2012-02-18 MED ORDER — ATORVASTATIN CALCIUM 10 MG PO TABS: 10.0000 mg | ORAL_TABLET | Freq: Every day | ORAL | Status: DC

## 2012-02-18 MED ORDER — BUPROPION HCL ER (XL) 150 MG PO TB24: 300.0000 mg | ORAL_TABLET | Freq: Every day | ORAL | Status: DC

## 2012-02-18 MED ORDER — SERTRALINE HCL 50 MG PO TABS: 50.0000 mg | ORAL_TABLET | Freq: Every day | ORAL | Status: DC

## 2012-02-18 NOTE — Telephone Encounter (Signed)
ATORVASTATIN 10 MG TABLET 10 MG 90 DAY SUPPLY   BUPROPION HCL XL 150 MG TABLET 150 MG  90 DAY SUPPLY    SERTRALINE  HCL 50 MG TABLET 50 MG 90 DAY SUPPLY

## 2012-02-18 NOTE — Telephone Encounter (Signed)
Refill done.  

## 2012-03-21 ENCOUNTER — Telehealth: Payer: Self-pay | Admitting: Internal Medicine

## 2012-03-21 MED ORDER — CLONAZEPAM 0.5 MG PO TABS: 1.0000 mg | ORAL_TABLET | Freq: Every evening | ORAL | Status: DC | PRN

## 2012-03-21 NOTE — Telephone Encounter (Signed)
CLONAZEPAM 0.5MG  TABLET LAST FILL: 02/23/11 TAKE 2 TABLETS BY MOUTH AS NEEDED AT BEDTIME FOR ANXIETY

## 2012-03-21 NOTE — Telephone Encounter (Signed)
Spoke with pharmacy and the refill was not put into the system. I went ahead and sent the 60 with 0 refills.    KP

## 2012-04-28 ENCOUNTER — Other Ambulatory Visit: Payer: Self-pay

## 2012-04-28 NOTE — Telephone Encounter (Signed)
Pt called for refill on klonopin last seen 11/08/11 last filled 03/21/12 #60 plz advise          MW

## 2012-05-01 MED ORDER — CLONAZEPAM 0.5 MG PO TABS: 1.0000 mg | ORAL_TABLET | Freq: Every evening | ORAL | Status: AC | PRN

## 2012-05-01 NOTE — Telephone Encounter (Signed)
Done

## 2012-05-03 ENCOUNTER — Other Ambulatory Visit: Payer: Self-pay

## 2012-05-11 ENCOUNTER — Encounter: Payer: Self-pay | Admitting: Family Medicine

## 2012-05-11 ENCOUNTER — Other Ambulatory Visit: Payer: Self-pay | Admitting: Family Medicine

## 2012-05-11 ENCOUNTER — Ambulatory Visit (INDEPENDENT_AMBULATORY_CARE_PROVIDER_SITE_OTHER): Payer: BC Managed Care – PPO | Admitting: Family Medicine

## 2012-05-11 VITALS — BP 124/78 | HR 68 | Temp 98.3°F | Ht 66.0 in | Wt 168.8 lb

## 2012-05-11 DIAGNOSIS — F341 Dysthymic disorder: Secondary | ICD-10-CM

## 2012-05-11 DIAGNOSIS — R5381 Other malaise: Secondary | ICD-10-CM

## 2012-05-11 DIAGNOSIS — Z23 Encounter for immunization: Secondary | ICD-10-CM

## 2012-05-11 DIAGNOSIS — Z0000 Encounter for general adult medical examination without abnormal findings: Secondary | ICD-10-CM

## 2012-05-11 DIAGNOSIS — R5383 Other fatigue: Secondary | ICD-10-CM | POA: Insufficient documentation

## 2012-05-11 DIAGNOSIS — E785 Hyperlipidemia, unspecified: Secondary | ICD-10-CM

## 2012-05-11 DIAGNOSIS — F329 Major depressive disorder, single episode, unspecified: Secondary | ICD-10-CM

## 2012-05-11 LAB — CBC WITH DIFFERENTIAL/PLATELET
Basophils Absolute: 0 10*3/uL (ref 0.0–0.1)
Basophils Relative: 0.3 % (ref 0.0–3.0)
Eosinophils Relative: 1.4 % (ref 0.0–5.0)
Hemoglobin: 14.4 g/dL (ref 12.0–15.0)
Lymphocytes Relative: 36 % (ref 12.0–46.0)
Monocytes Relative: 11.4 % (ref 3.0–12.0)
Neutro Abs: 3.4 10*3/uL (ref 1.4–7.7)
RBC: 4.54 Mil/uL (ref 3.87–5.11)
WBC: 6.7 10*3/uL (ref 4.5–10.5)

## 2012-05-11 LAB — COMPREHENSIVE METABOLIC PANEL
ALT: 31 U/L (ref 0–35)
AST: 23 U/L (ref 0–37)
Albumin: 4.3 g/dL (ref 3.5–5.2)
Calcium: 9.3 mg/dL (ref 8.4–10.5)
Chloride: 110 mEq/L (ref 96–112)
Potassium: 4.1 mEq/L (ref 3.5–5.1)
Total Protein: 7.5 g/dL (ref 6.0–8.3)

## 2012-05-11 LAB — LIPID PANEL
Cholesterol: 167 mg/dL (ref 0–200)
LDL Cholesterol: 112 mg/dL — ABNORMAL HIGH (ref 0–99)
Total CHOL/HDL Ratio: 6

## 2012-05-11 MED ORDER — BUPROPION HCL ER (XL) 150 MG PO TB24: 450.0000 mg | ORAL_TABLET | Freq: Every day | ORAL | Status: DC

## 2012-05-11 MED ORDER — SERTRALINE HCL 50 MG PO TABS: 50.0000 mg | ORAL_TABLET | Freq: Every day | ORAL | Status: DC

## 2012-05-11 NOTE — Assessment & Plan Note (Signed)
Preventative protocols reviewed and updated unless pt declined. Discussed healthy diet and lifestyle. Discussed sunbathing, rec against this. Flu shot today.

## 2012-05-11 NOTE — Assessment & Plan Note (Addendum)
Increase wellbutrin XL to 450mg  daily, decrease sertraline to 50mg  daily.  wellbutrin seems to work better for her. rtc 1-2 mo for recheck, if no noted improvement, consider referral to psych. Encouraged restarting zumba.  PHQ9 = 21/27 GAD7 = 18/21

## 2012-05-11 NOTE — Assessment & Plan Note (Signed)
Check for reversible causes today. Encouraged increased exercise.

## 2012-05-11 NOTE — Progress Notes (Signed)
Subjective:    Patient ID: Julia Frank, female    DOB: 08/10/1975, 36 y.o.   MRN: [id number]  HPI CC: transfer of care from Dr. Drue Novel, would like CPE.  Would like cholesterol levels checked as well as any other blood work needed.  Would like thyroid checked.  Sister possibly with thyroid problem.    Having increased stress recently.  More fatigue recently.  Recently having trouble with depression.  Only wants to sleep all day.  H/o panic attacks, controlled on clonazepam nightly.  Would like to increase klonopin.  Considering referral to psychiatrist.  Preventative: Well woman with OBGYN. mammogram last year Tdap 2009. Would like flu shot.  Original from [place], moved to [place].  Occupation: 2nd grade teacher Activity: zumba  Medications and allergies reviewed and updated in chart.  Past histories reviewed and updated if relevant as below. Patient Active Problem List  Diagnosis  . HYPERLIPIDEMIA  . TOBACCO ABUSE  . Anxiety and depression  . INSOMNIA-SLEEP DISORDER-UNSPEC  . Abnormal CBC   Past Medical History  Diagnosis Date  . Elevated cholesterol   . Insomnia   . Anxiety     h/o panic attacks  . Depression    Past Surgical History  Procedure Date  . Tonsillectomy    History  Substance Use Topics  . Smoking status: Current Some Day Smoker    Types: Cigarettes  . Smokeless tobacco: Never Used   Comment: 1-3 cigarettes with wine  . Alcohol Use: Yes     socially-wine/weekends   Family History  Problem Relation Age of Onset  . Hypertension Mother   . Coronary artery disease Paternal Grandfather   . Cancer Neg Hx   . Diabetes Maternal Grandmother   . Diabetes Maternal Grandfather   . Stroke Neg Hx    No Known Allergies Current Outpatient Prescriptions on File Prior to Visit  Medication Sig Dispense Refill  . clonazePAM (KLONOPIN) 0.5 MG tablet Take 2 tablets (1 mg total) by mouth at bedtime as needed for anxiety.  60 tablet  3  . medroxyPROGESTERone  (DEPO-PROVERA) 150 MG/ML injection Inject 150 mg into the muscle every 3 (three) months.      . DISCONTD: buPROPion (WELLBUTRIN XL) 150 MG 24 hr tablet Take 2 tablets (300 mg total) by mouth daily.  180 tablet  1  . diclofenac (CATAFLAM) 50 MG tablet Take 1 tablet (50 mg total) by mouth 3 (three) times daily as needed (pain).  60 tablet  0      Review of Systems  Constitutional: Negative for fever, chills, activity change, appetite change, fatigue and unexpected weight change.  HENT: Negative for hearing loss and neck pain.   Eyes: Negative for visual disturbance.  Respiratory: Negative for cough, chest tightness, shortness of breath and wheezing.   Cardiovascular: Negative for chest pain, palpitations and leg swelling.  Gastrointestinal: Positive for diarrhea. Negative for nausea, vomiting, abdominal pain, constipation, blood in stool and abdominal distention.  Genitourinary: Negative for hematuria and difficulty urinating.  Musculoskeletal: Negative for myalgias and arthralgias.  Skin: Negative for rash.  Neurological: Negative for dizziness, seizures, syncope and headaches.  Hematological: Does not bruise/bleed easily.  Psychiatric/Behavioral: Positive for dysphoric mood. The patient is nervous/anxious.        Objective:   Physical Exam  Nursing note and vitals reviewed. Constitutional: She is oriented to person, place, and time. She appears well-developed and well-nourished. No distress.  HENT:  Head: Normocephalic and atraumatic.  Right Ear: External ear normal.  Left  Ear: External ear normal.  Nose: Nose normal.  Mouth/Throat: Oropharynx is clear and moist. No oropharyngeal exudate.  Eyes: Conjunctivae normal and EOM are normal. Pupils are equal, round, and reactive to light. No scleral icterus.  Neck: Normal range of motion. Neck supple. No thyromegaly present.  Cardiovascular: Normal rate, regular rhythm, normal heart sounds and intact distal pulses.   No murmur  heard. Pulses:      Radial pulses are 2+ on the right side, and 2+ on the left side.  Pulmonary/Chest: Effort normal and breath sounds normal. No respiratory distress. She has no wheezes. She has no rales.  Abdominal: Soft. Bowel sounds are normal. She exhibits no distension and no mass. There is no tenderness. There is no rebound and no guarding.  Musculoskeletal: Normal range of motion. She exhibits no edema.  Lymphadenopathy:    She has no cervical adenopathy.  Neurological: She is alert and oriented to person, place, and time.       CN grossly intact, station and gait intact  Skin: Skin is warm and dry. No rash noted.       Solar lentigoes throughout  Psychiatric: She has a normal mood and affect. Her behavior is normal. Judgment and thought content normal.       Assessment & Plan:

## 2012-05-11 NOTE — Patient Instructions (Addendum)
Increase wellbutrin to 450mg  daily Decrease sertraline to 50mg  daily. Trial of this for 1-2 months, then let me know how things are doing.  If not improving, I will refer you to psychiatry (Dr. Milagros Evener?).  Dr. Toni Amend en [facility].  Dr. Sharl Ma in [place]. chequearemos sangre hoy (blood work today). Good to see you today, call us with questions.

## 2012-05-11 NOTE — Assessment & Plan Note (Signed)
Check FLP today. 

## 2012-05-16 ENCOUNTER — Ambulatory Visit (INDEPENDENT_AMBULATORY_CARE_PROVIDER_SITE_OTHER): Payer: BC Managed Care – PPO | Admitting: *Deleted

## 2012-05-16 ENCOUNTER — Telehealth: Payer: Self-pay | Admitting: *Deleted

## 2012-05-16 DIAGNOSIS — R5383 Other fatigue: Secondary | ICD-10-CM

## 2012-05-16 MED ORDER — CYANOCOBALAMIN 1000 MCG/ML IJ SOLN
1000.0000 ug | Freq: Once | INTRAMUSCULAR | Status: AC
  Administered 2012-05-16: 1000 ug via INTRAMUSCULAR

## 2012-05-16 NOTE — Telephone Encounter (Signed)
Pt called for lab results, she was extremely upset that no one had called her with her results. I apologized and gave her the lab results. I also scheduled her 1st b12 shot today and advised her I would have a copy of her labs, cholesterol diet and info on b12 deficency for her when she came in today.

## 2012-05-17 NOTE — Telephone Encounter (Signed)
Noted.  Understandably upset as my recommendations were on labwork since 05/11/2012 at 3:47 PM Will route to [place] as fyi.

## 2012-05-18 ENCOUNTER — Telehealth: Payer: Self-pay

## 2012-05-18 NOTE — Telephone Encounter (Signed)
Pt received copy of recent labs but wanted labs emailed so she could send to her country. I spoke with pt about My chart and she had tried to activate her my chart but said she could not. Advised pt she could call technical help desk and was going to give pt the contact # when she said she did not have time to do that and pt ended call.

## 2012-05-26 ENCOUNTER — Other Ambulatory Visit: Payer: Self-pay

## 2012-05-26 NOTE — Telephone Encounter (Signed)
Pt request refill clonazepam to [facility](pt not sure of address). In chart 05/01/12 Dr Drue Novel gave written rx for clonazepam # 60 with 3 additional refills. Pt said OK.

## 2012-06-20 ENCOUNTER — Ambulatory Visit (INDEPENDENT_AMBULATORY_CARE_PROVIDER_SITE_OTHER): Payer: BC Managed Care – PPO | Admitting: Family Medicine

## 2012-06-20 ENCOUNTER — Encounter: Payer: Self-pay | Admitting: Family Medicine

## 2012-06-20 VITALS — BP 110/60 | HR 62 | Temp 98.1°F | Wt 171.0 lb

## 2012-06-20 DIAGNOSIS — E538 Deficiency of other specified B group vitamins: Secondary | ICD-10-CM | POA: Insufficient documentation

## 2012-06-20 DIAGNOSIS — F39 Unspecified mood [affective] disorder: Secondary | ICD-10-CM

## 2012-06-20 DIAGNOSIS — E785 Hyperlipidemia, unspecified: Secondary | ICD-10-CM

## 2012-06-20 MED ORDER — SERTRALINE HCL 50 MG PO TABS: 25.0000 mg | ORAL_TABLET | Freq: Every day | ORAL | Status: AC

## 2012-06-20 MED ORDER — BUPROPION HCL ER (XL) 150 MG PO TB24: 150.0000 mg | ORAL_TABLET | Freq: Every day | ORAL | Status: AC

## 2012-06-20 NOTE — Assessment & Plan Note (Signed)
reviewed #s.  Continue lipitor.  HDL too low - encouraged aerobic exercise.

## 2012-06-20 NOTE — Progress Notes (Signed)
  Subjective:    Patient ID: Julia Frank, female    DOB: March 12, 1976, 36 y.o.   MRN: [id number]  HPI CC: discuss meds  Discussed labs.  Low B12.  Does not regularly dairy products, does not regularly eat meat.  Started IM injection recently.   Lab Results  Component Value Date   VITAMINB12 175* 05/11/2012    Mood disorder - last visit wellbutrin XL increased to 450mg  daily as well as klonopin 1mg  nightly.  Also taking sertraline 25mg  daily.  Significant irritability.  Actually saw psychiatrist (unsure name) last week.  Diagnosed bipolar in [place].  Local psych agreed with bipolar diagnosis.  Has run out of wellbutrin.  About to run out of sertraline.  Has many questions about meds to take.  Would like second opinion.  Review of Systems Per HPI    Objective:   Physical Exam  Nursing note and vitals reviewed. Constitutional: She appears well-developed and well-nourished. No distress.  Musculoskeletal: She exhibits no edema.  Psychiatric: She has a normal mood and affect. Her speech is normal and behavior is normal. Judgment and thought content normal. Her mood appears not anxious. Cognition and memory are normal. She does not exhibit a depressed mood.       Assessment & Plan:

## 2012-06-20 NOTE — Assessment & Plan Note (Signed)
Possible bipolar disorder, anticipate type 2. Pt has many questions about bipolar meds and mood disorder.  Answered questions. Offered several different options regarding plan of treatment.   Pt would like to continue current meds, but decrease wellbutrin to 150mg  (as 450mg  xl dose attributed to worsening anxiety and irritability). Will set up another opinion with psych and discussion of mood stabilizers then. A total of 25 minutes were spent face-to-face with the patient during this encounter and over half of that time was spent on counseling and coordination of care

## 2012-06-20 NOTE — Assessment & Plan Note (Signed)
IM supplementation started 05/2012.  rec 8 mo shots, then recheck and consider oral transition.  If staying low, check intrinsic factor.

## 2012-06-20 NOTE — Patient Instructions (Addendum)
(  Dr. Milagros Evener). Dr. Toni Amend en [facility]. Dr. Sharl Ma in [place]. empieze wellbutrin 150mg  siga sertraline 25mg  diarios. siga B12 intramuscular. llamenos con preguntas.

## 2012-06-21 ENCOUNTER — Ambulatory Visit: Payer: BC Managed Care – PPO

## 2012-07-11 ENCOUNTER — Ambulatory Visit: Payer: BC Managed Care – PPO | Admitting: Family Medicine

## 2012-07-24 ENCOUNTER — Ambulatory Visit: Payer: BC Managed Care – PPO | Admitting: Family Medicine

## 2012-07-24 DIAGNOSIS — Z0289 Encounter for other administrative examinations: Secondary | ICD-10-CM

## 2012-07-26 ENCOUNTER — Ambulatory Visit (INDEPENDENT_AMBULATORY_CARE_PROVIDER_SITE_OTHER): Payer: BC Managed Care – PPO | Admitting: *Deleted

## 2012-07-26 DIAGNOSIS — E538 Deficiency of other specified B group vitamins: Secondary | ICD-10-CM

## 2012-07-26 MED ORDER — CYANOCOBALAMIN 1000 MCG/ML IJ SOLN
1000.0000 ug | Freq: Once | INTRAMUSCULAR | Status: AC
  Administered 2012-07-26: 1000 ug via INTRAMUSCULAR

## 2012-08-30 ENCOUNTER — Ambulatory Visit: Payer: BC Managed Care – PPO

## 2012-09-05 ENCOUNTER — Ambulatory Visit: Payer: BC Managed Care – PPO

## 2012-09-12 ENCOUNTER — Ambulatory Visit (INDEPENDENT_AMBULATORY_CARE_PROVIDER_SITE_OTHER): Payer: BC Managed Care – PPO | Admitting: *Deleted

## 2012-09-12 DIAGNOSIS — E538 Deficiency of other specified B group vitamins: Secondary | ICD-10-CM

## 2012-09-12 MED ORDER — CYANOCOBALAMIN 1000 MCG/ML IJ SOLN
1000.0000 ug | Freq: Once | INTRAMUSCULAR | Status: AC
  Administered 2012-09-12: 1000 ug via INTRAMUSCULAR

## 2012-10-04 ENCOUNTER — Ambulatory Visit (INDEPENDENT_AMBULATORY_CARE_PROVIDER_SITE_OTHER): Payer: BC Managed Care – PPO | Admitting: *Deleted

## 2012-10-04 DIAGNOSIS — E538 Deficiency of other specified B group vitamins: Secondary | ICD-10-CM

## 2012-10-05 DIAGNOSIS — E538 Deficiency of other specified B group vitamins: Secondary | ICD-10-CM

## 2012-10-05 MED ORDER — CYANOCOBALAMIN 1000 MCG/ML IJ SOLN
1000.0000 ug | Freq: Once | INTRAMUSCULAR | Status: AC
  Administered 2012-10-05: 1000 ug via INTRAMUSCULAR

## 2012-11-02 ENCOUNTER — Ambulatory Visit: Payer: BC Managed Care – PPO | Admitting: Family Medicine

## 2012-11-02 DIAGNOSIS — Z0289 Encounter for other administrative examinations: Secondary | ICD-10-CM

## 2013-04-25 IMAGING — CR DG FOOT 2V*L*
2 series · 2 of 2 positions shown · non-contrast
Comparison: None.

CLINICAL DATA: Fall last week.  Pain at proximal fifth toe.

LEFT FOOT - 2 VIEW

[view not recorded (1 of 2)]
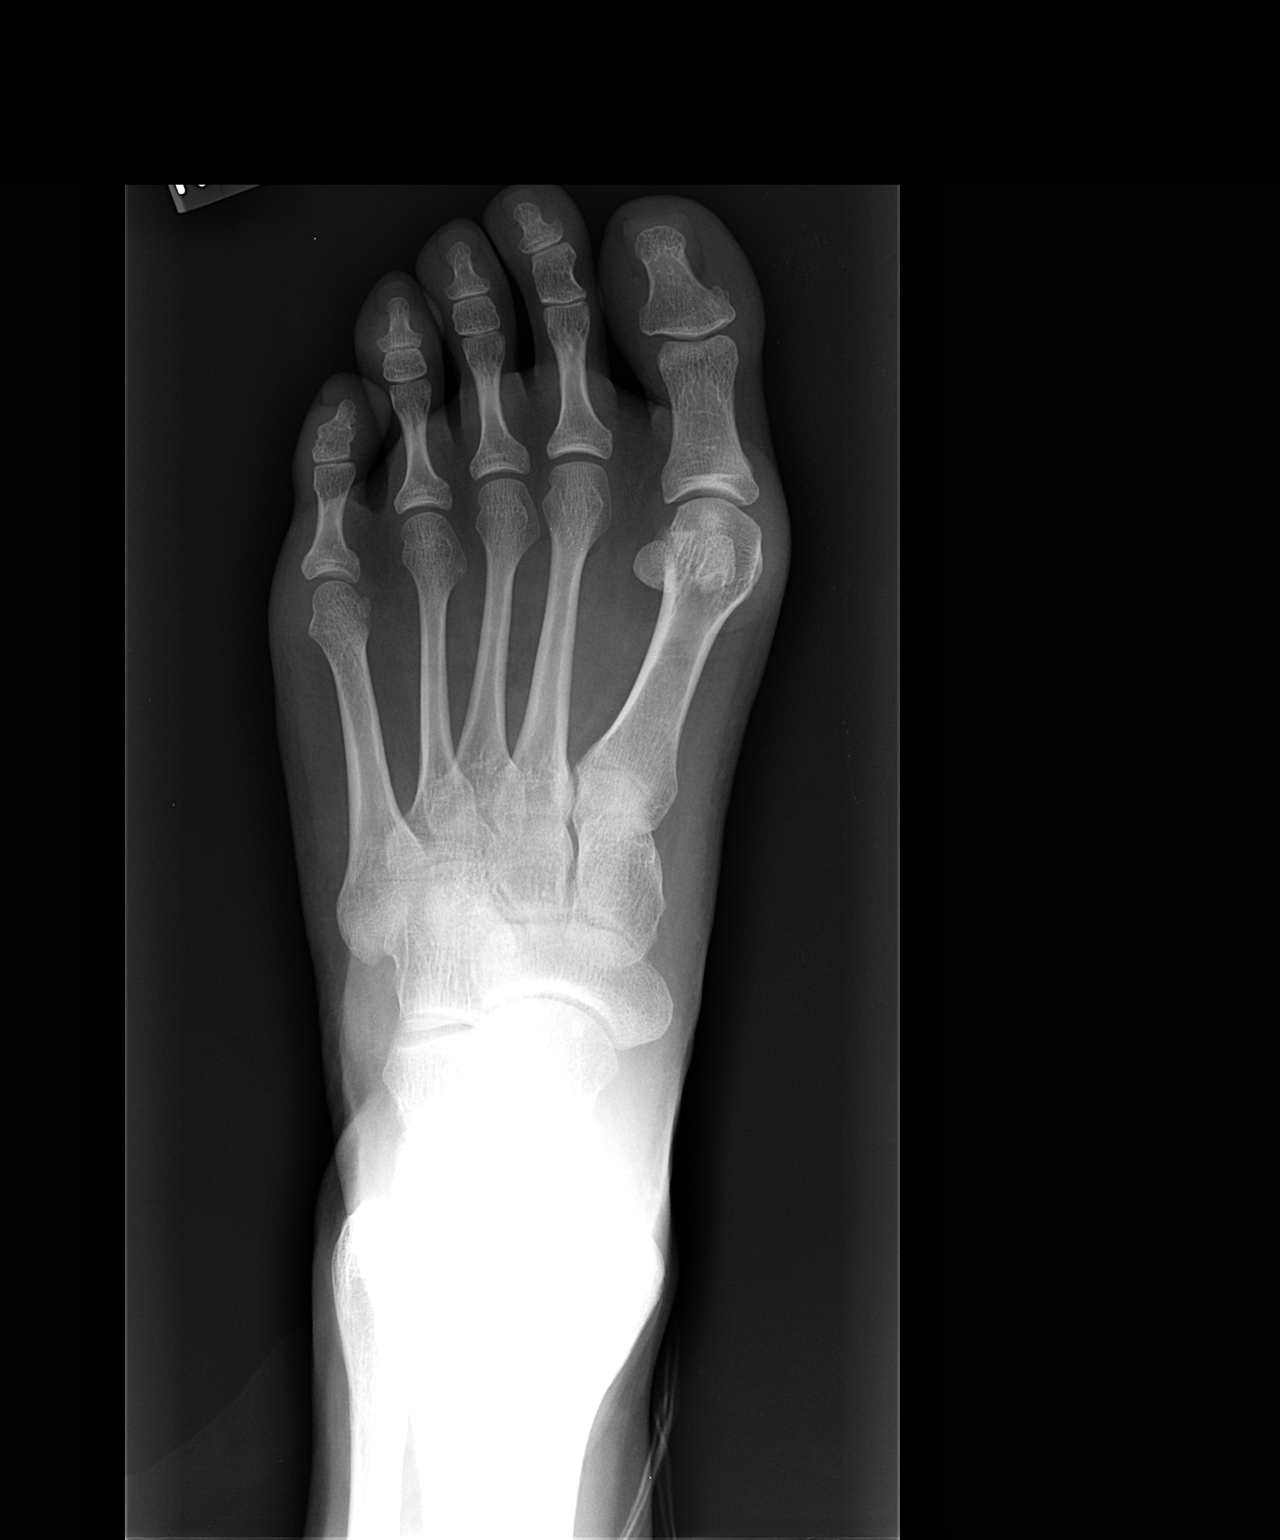

[view not recorded (2 of 2)]
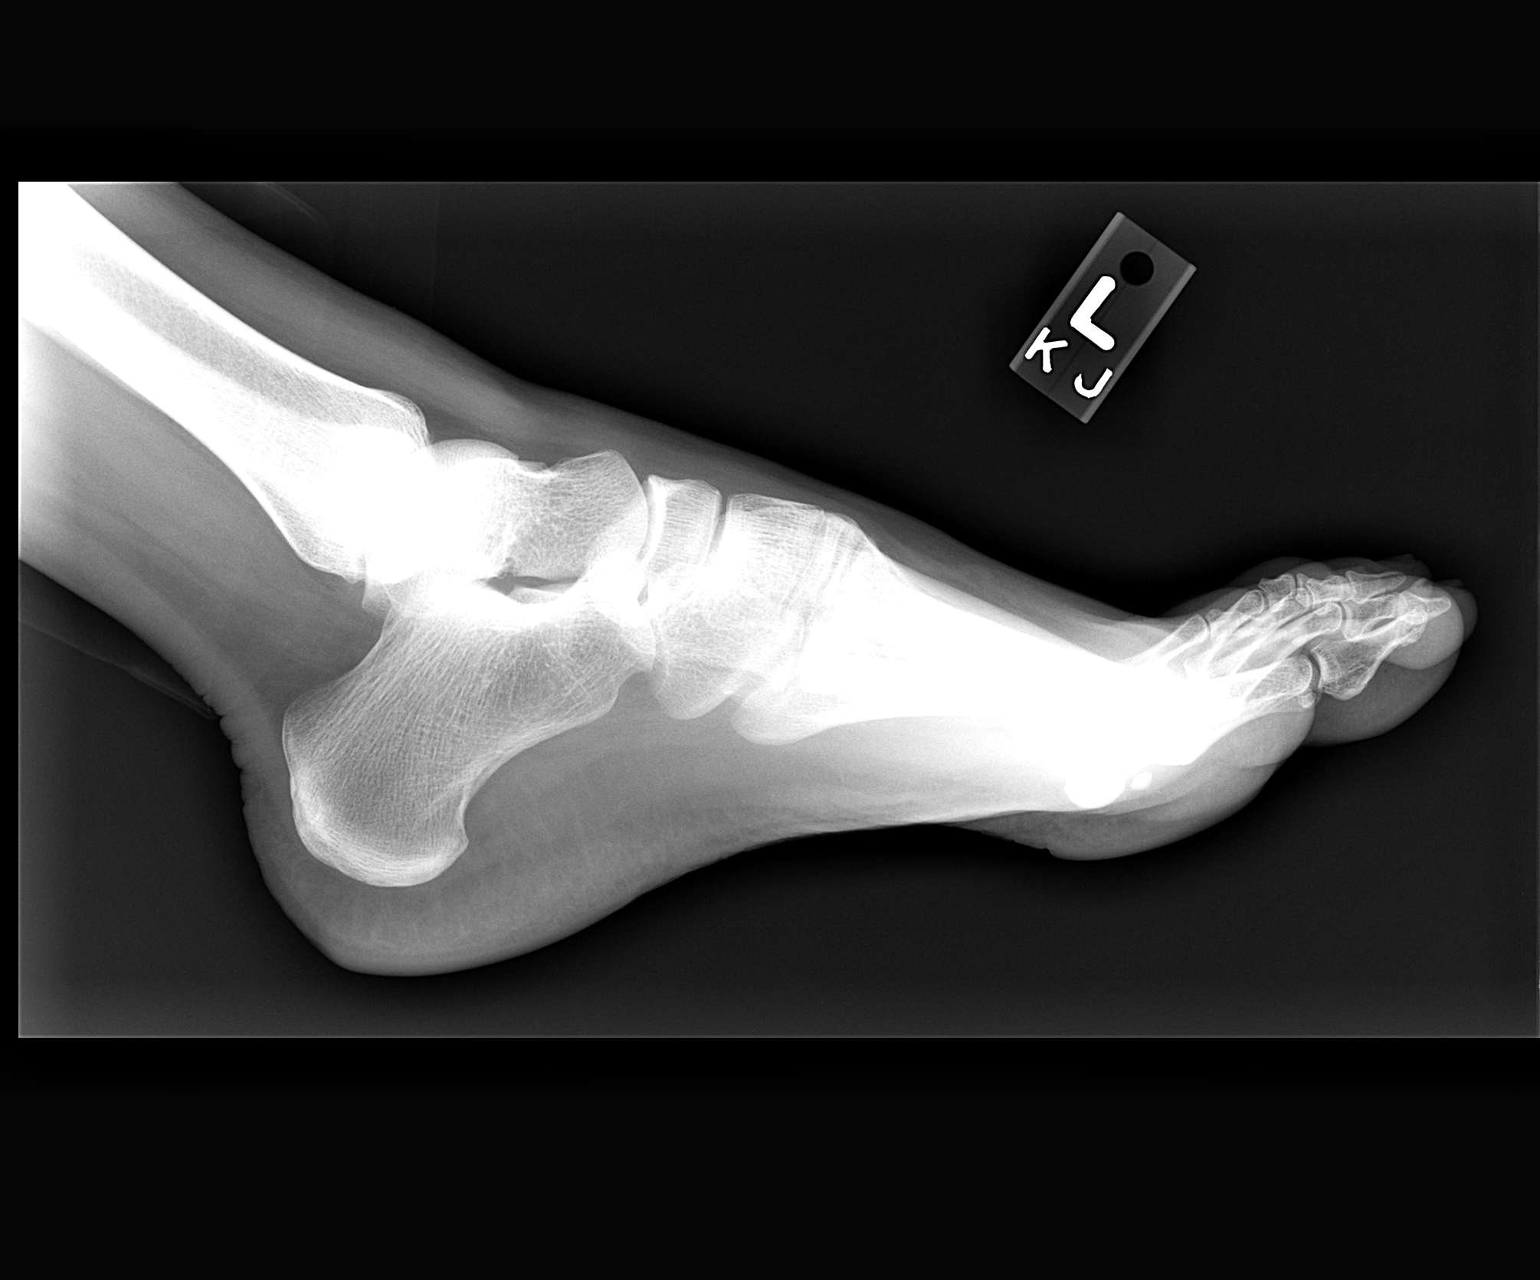

[2 of 2 positions shown; findings below may reference images not displayed]

FINDINGS: No acute fracture or dislocation.  No significant soft
tissue swelling.  No oblique views performed.
IMPRESSION: No acute findings about the left foot.

## 2013-05-15 ENCOUNTER — Encounter: Payer: Self-pay | Admitting: Family Medicine

## 2013-10-24 ENCOUNTER — Telehealth: Payer: Self-pay

## 2013-10-24 NOTE — Telephone Encounter (Signed)
Pt requesting refill of ambien; pt said psychiatrist filled med. I asked pt if she had upcoming appt scheduled and she asked what doctors office was this; I advised [facility]. Pt said she had wrong # and hung up.
